# Patient Record
Sex: Female | Born: 2000 | Race: White | Hispanic: No | Marital: Single | Smoking: Never smoker
Health system: Southern US, Community
[De-identification: ages and names within clinical notes are randomized; demographics above are authoritative.]

## PROBLEM LIST (undated history)

## (undated) DIAGNOSIS — R51 Headache: Secondary | ICD-10-CM

## (undated) HISTORY — DX: Headache: R51

## (undated) HISTORY — PX: MOUTH SURGERY: SHX715

---
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
## 2020-10-05 DIAGNOSIS — N926 Irregular menstruation, unspecified: Secondary | ICD-10-CM | POA: Diagnosis not present

## 2020-10-05 DIAGNOSIS — F324 Major depressive disorder, single episode, in partial remission: Secondary | ICD-10-CM | POA: Diagnosis not present

## 2020-10-05 DIAGNOSIS — J309 Allergic rhinitis, unspecified: Secondary | ICD-10-CM | POA: Diagnosis not present

## 2021-05-22 DIAGNOSIS — Z131 Encounter for screening for diabetes mellitus: Secondary | ICD-10-CM | POA: Diagnosis not present

## 2021-05-22 DIAGNOSIS — Z0000 Encounter for general adult medical examination without abnormal findings: Secondary | ICD-10-CM | POA: Diagnosis not present

## 2021-05-22 DIAGNOSIS — Z13220 Encounter for screening for lipoid disorders: Secondary | ICD-10-CM | POA: Diagnosis not present

## 2021-05-22 DIAGNOSIS — N926 Irregular menstruation, unspecified: Secondary | ICD-10-CM | POA: Diagnosis not present

## 2021-06-09 IMAGING — CT CT OF THE LEFT FINGERS WITH CONTRAST
3 of 10 series · 13 of 35 positions shown, 15 images · IV contrast (iopamidol)
Comparison: MRI left hand dated July 12, 2019. Left hand x-rays
dated May 20, 2019.

CLINICAL DATA: Third distal phalanx lesion.

EXAM:
CT OF THE UPPER LEFT EXTREMITY WITH CONTRAST
TECHNIQUE: Multidetector CT imaging of the left hand was performed according to
the standard protocol following intravenous contrast administration.
CONTRAST:  75mL UJFKM3-5CC IOPAMIDOL (UJFKM3-5CC) INJECTION 61%

[Series 3: wrist 1.50 br60 s3 axial bone hd fov · axial · 0.22mm/px · z∈[-633,-505]mm · 5 of 240 slices shown, 7 images]
[im 40/240  soft-tissue]
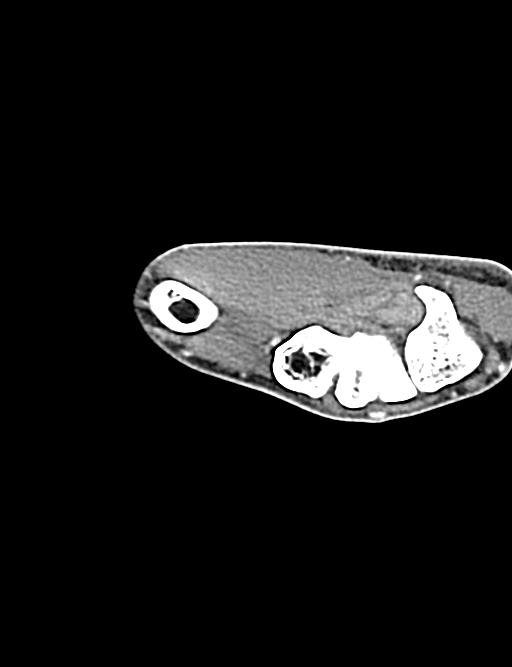
[im 40/240  bone]
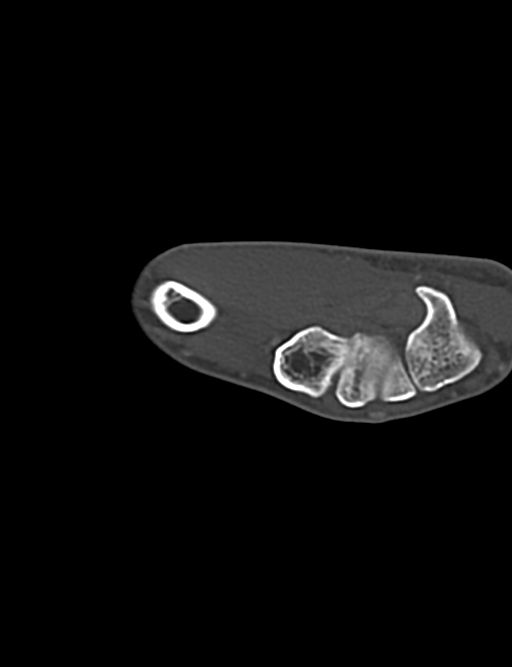
[im 80/240  bone]
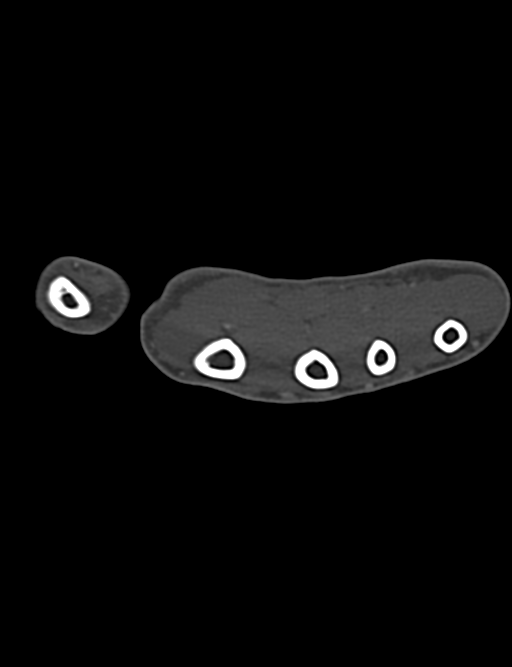
[im 120/240  bone]
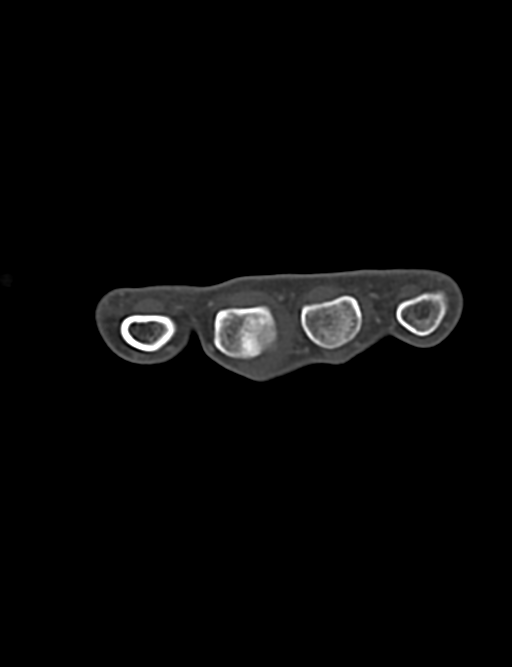
[im 160/240  bone]
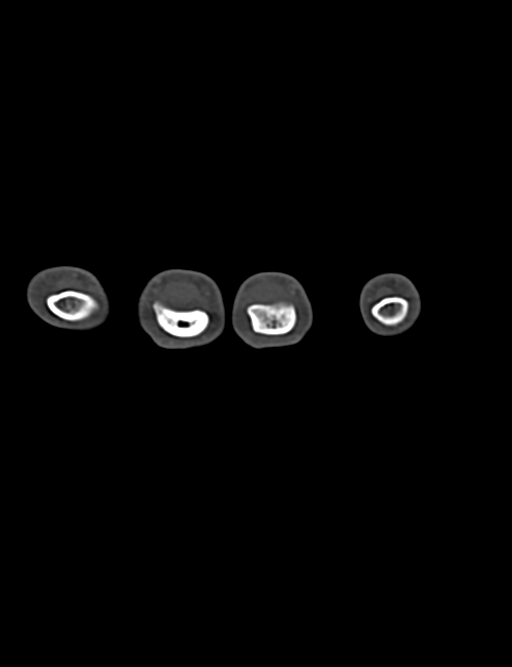
[im 200/240  soft-tissue]
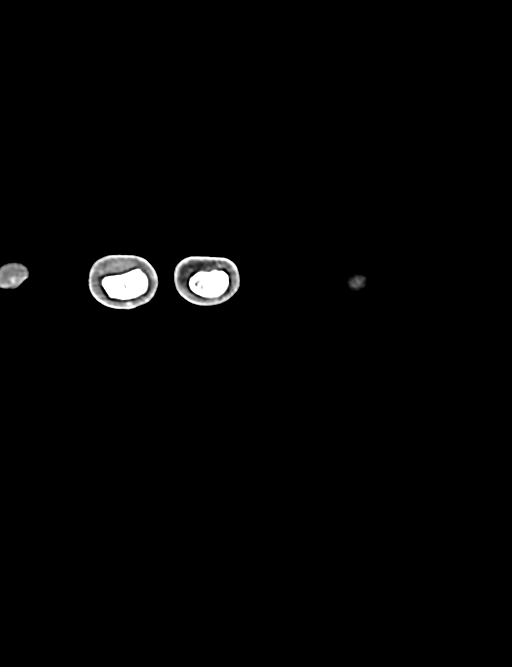
[im 200/240  bone]
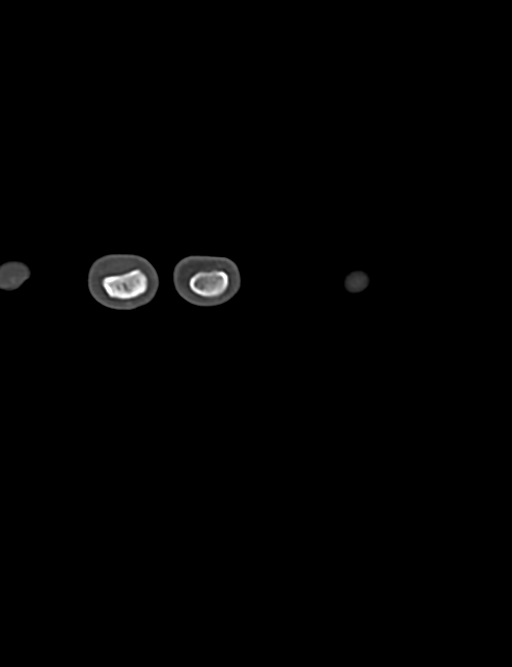

[Series 5: wrist 1.50 br40 s3 axial st hd fov · axial · 0.22mm/px · z∈[-633,-506]mm · 5 of 239 slices shown]
[im 40/239  bone]
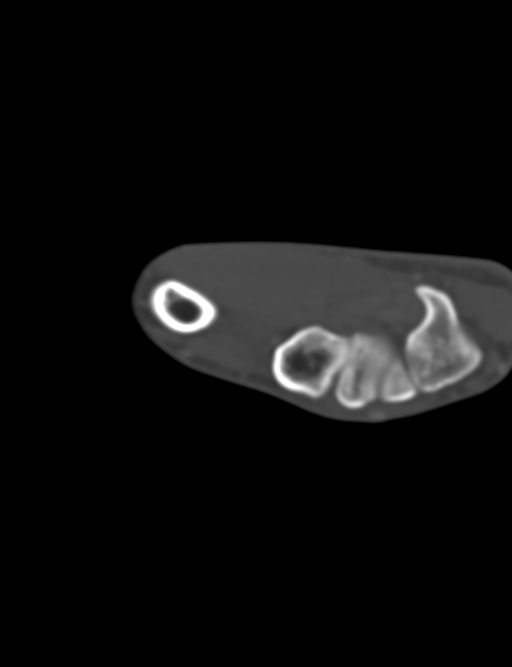
[im 80/239  bone]
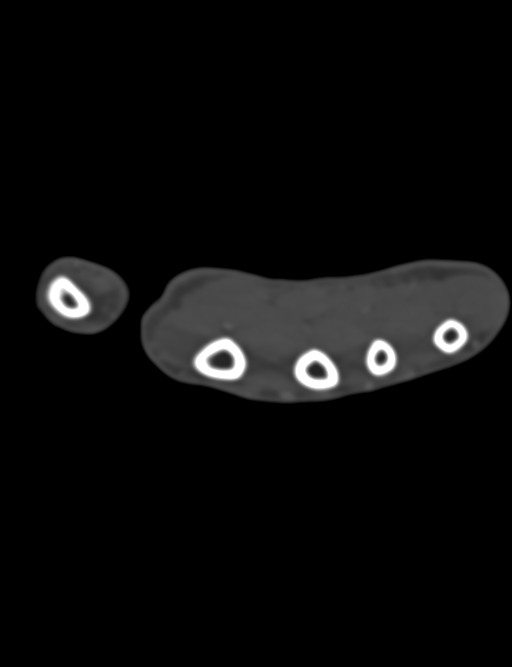
[im 120/239  bone]
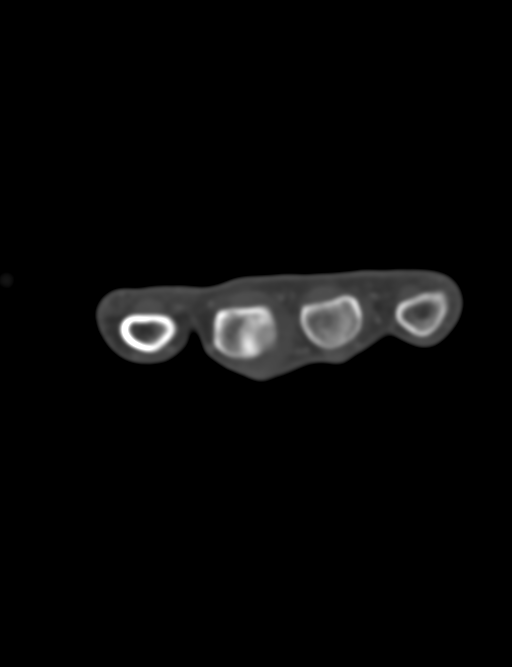
[im 159/239  bone]
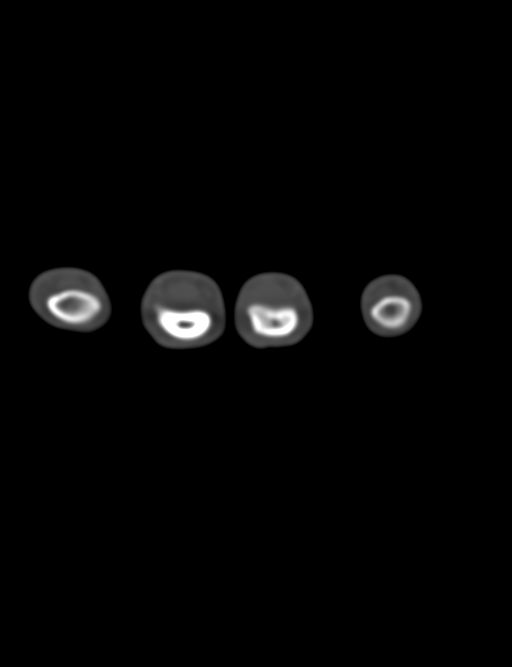
[im 199/239  bone]
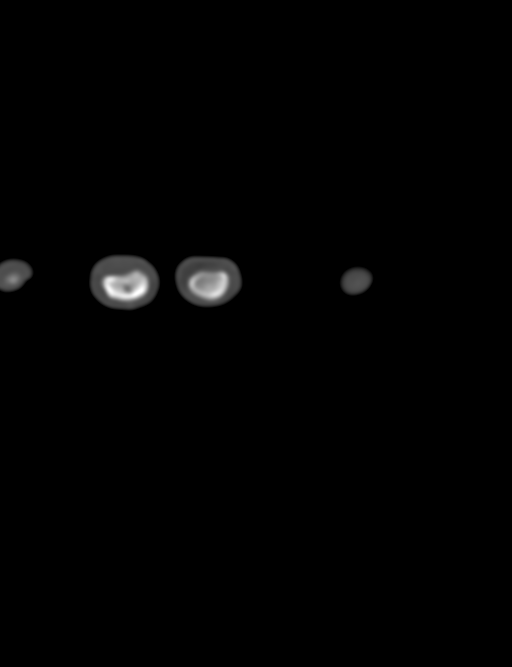

[Series 11: wrist 1.50 br60 s3 sag bone hd fov · sagittal · 0.29mm/px · 3 of 143 slices shown]
[im 36/143  bone]
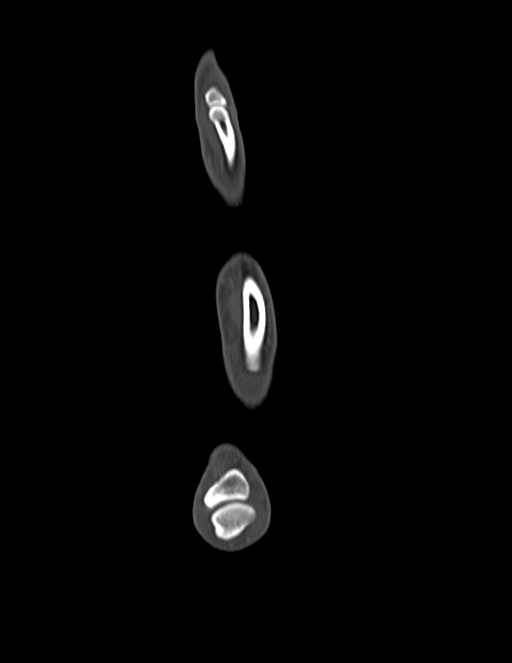
[im 72/143  bone]
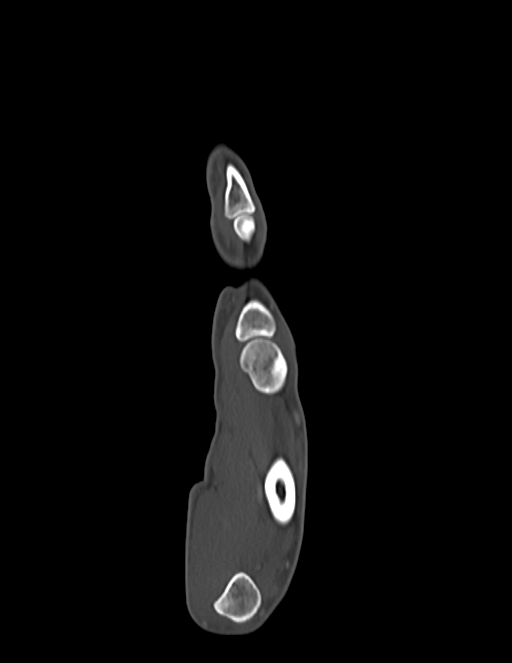
[im 107/143  bone]
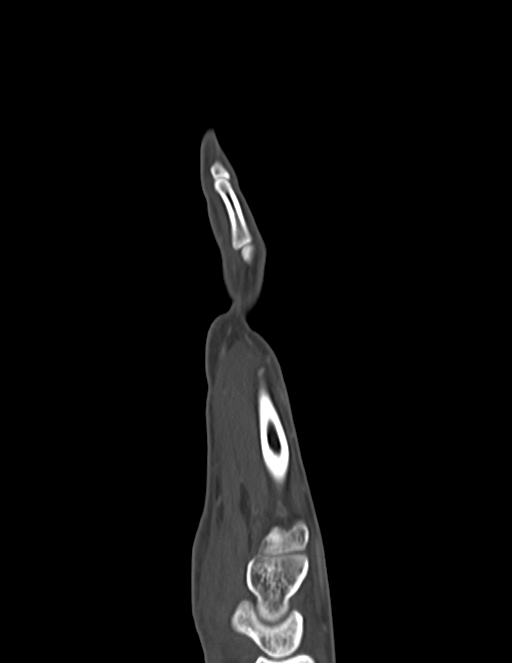

[13 of 35 positions shown; findings below may reference images not displayed]

FINDINGS: Bones/Joint/Cartilage

Again seen is a 4 x 4 x 5 mm exophytic, osseous lesion arising from
the radial and dorsal aspect of the third distal phalanx tuft deep
to the nail bed. This is continuous with the underlying cortex, and
possibly the medullary cavity. No osseous destruction or periosteal
reaction. No fracture or dislocation. Normal alignment. No joint
effusion.

Ligaments

Ligaments are suboptimally evaluated by CT.

Muscles and Tendons
Grossly intact.

Soft tissue
No fluid collection or hematoma.  No soft tissue mass.
IMPRESSION: 1. Non-aggressive appearing 4 x 4 x 5 mm exophytic bony lesion
arising from the third distal phalanx tuft deep to the nail bed.
Characteristics are suggestive of osteochondroma, bizarre parosteal
osteochondromatous proliferation, or subungual exostosis.

## 2021-08-21 DIAGNOSIS — H6121 Impacted cerumen, right ear: Secondary | ICD-10-CM | POA: Diagnosis not present

## 2021-08-21 DIAGNOSIS — L309 Dermatitis, unspecified: Secondary | ICD-10-CM | POA: Diagnosis not present

## 2022-01-14 DIAGNOSIS — S43001A Unspecified subluxation of right shoulder joint, initial encounter: Secondary | ICD-10-CM | POA: Diagnosis not present

## 2022-02-26 DIAGNOSIS — F413 Other mixed anxiety disorders: Secondary | ICD-10-CM | POA: Diagnosis not present

## 2022-03-12 DIAGNOSIS — F413 Other mixed anxiety disorders: Secondary | ICD-10-CM | POA: Diagnosis not present

## 2022-03-26 DIAGNOSIS — F413 Other mixed anxiety disorders: Secondary | ICD-10-CM | POA: Diagnosis not present

## 2022-04-09 DIAGNOSIS — F413 Other mixed anxiety disorders: Secondary | ICD-10-CM | POA: Diagnosis not present

## 2022-04-23 DIAGNOSIS — F413 Other mixed anxiety disorders: Secondary | ICD-10-CM | POA: Diagnosis not present

## 2022-05-14 DIAGNOSIS — F413 Other mixed anxiety disorders: Secondary | ICD-10-CM | POA: Diagnosis not present

## 2022-05-28 DIAGNOSIS — Z113 Encounter for screening for infections with a predominantly sexual mode of transmission: Secondary | ICD-10-CM | POA: Diagnosis not present

## 2022-05-28 DIAGNOSIS — Z13220 Encounter for screening for lipoid disorders: Secondary | ICD-10-CM | POA: Diagnosis not present

## 2022-05-28 DIAGNOSIS — Z124 Encounter for screening for malignant neoplasm of cervix: Secondary | ICD-10-CM | POA: Diagnosis not present

## 2022-05-28 DIAGNOSIS — Z0000 Encounter for general adult medical examination without abnormal findings: Secondary | ICD-10-CM | POA: Diagnosis not present

## 2022-05-28 DIAGNOSIS — Z131 Encounter for screening for diabetes mellitus: Secondary | ICD-10-CM | POA: Diagnosis not present

## 2022-05-28 DIAGNOSIS — Z23 Encounter for immunization: Secondary | ICD-10-CM | POA: Diagnosis not present

## 2022-05-28 DIAGNOSIS — F413 Other mixed anxiety disorders: Secondary | ICD-10-CM | POA: Diagnosis not present

## 2022-06-11 DIAGNOSIS — F413 Other mixed anxiety disorders: Secondary | ICD-10-CM | POA: Diagnosis not present

## 2022-06-25 DIAGNOSIS — F413 Other mixed anxiety disorders: Secondary | ICD-10-CM | POA: Diagnosis not present

## 2022-07-16 DIAGNOSIS — F413 Other mixed anxiety disorders: Secondary | ICD-10-CM | POA: Diagnosis not present

## 2022-08-21 DIAGNOSIS — F413 Other mixed anxiety disorders: Secondary | ICD-10-CM | POA: Diagnosis not present

## 2022-10-22 DIAGNOSIS — F413 Other mixed anxiety disorders: Secondary | ICD-10-CM | POA: Diagnosis not present

## 2022-11-19 DIAGNOSIS — F413 Other mixed anxiety disorders: Secondary | ICD-10-CM | POA: Diagnosis not present

## 2023-03-05 DIAGNOSIS — S61452A Open bite of left hand, initial encounter: Secondary | ICD-10-CM | POA: Diagnosis not present

## 2023-07-23 DIAGNOSIS — F413 Other mixed anxiety disorders: Secondary | ICD-10-CM | POA: Diagnosis not present

## 2023-09-17 DIAGNOSIS — N390 Urinary tract infection, site not specified: Secondary | ICD-10-CM | POA: Diagnosis not present

## 2023-09-17 DIAGNOSIS — R300 Dysuria: Secondary | ICD-10-CM | POA: Diagnosis not present

## 2023-11-04 DIAGNOSIS — Z0000 Encounter for general adult medical examination without abnormal findings: Secondary | ICD-10-CM | POA: Diagnosis not present

## 2023-11-04 DIAGNOSIS — Z13220 Encounter for screening for lipoid disorders: Secondary | ICD-10-CM | POA: Diagnosis not present

## 2024-02-24 DIAGNOSIS — F4323 Adjustment disorder with mixed anxiety and depressed mood: Secondary | ICD-10-CM | POA: Diagnosis not present

## 2024-03-02 DIAGNOSIS — F4323 Adjustment disorder with mixed anxiety and depressed mood: Secondary | ICD-10-CM | POA: Diagnosis not present

## 2024-03-09 DIAGNOSIS — F4323 Adjustment disorder with mixed anxiety and depressed mood: Secondary | ICD-10-CM | POA: Diagnosis not present

## 2024-03-23 DIAGNOSIS — F4323 Adjustment disorder with mixed anxiety and depressed mood: Secondary | ICD-10-CM | POA: Diagnosis not present

## 2024-03-30 DIAGNOSIS — F4323 Adjustment disorder with mixed anxiety and depressed mood: Secondary | ICD-10-CM | POA: Diagnosis not present

## 2024-04-06 DIAGNOSIS — F4323 Adjustment disorder with mixed anxiety and depressed mood: Secondary | ICD-10-CM | POA: Diagnosis not present

## 2024-04-13 DIAGNOSIS — F4323 Adjustment disorder with mixed anxiety and depressed mood: Secondary | ICD-10-CM | POA: Diagnosis not present

## 2024-04-20 DIAGNOSIS — F4323 Adjustment disorder with mixed anxiety and depressed mood: Secondary | ICD-10-CM | POA: Diagnosis not present

## 2024-04-27 DIAGNOSIS — Z713 Dietary counseling and surveillance: Secondary | ICD-10-CM | POA: Diagnosis not present

## 2024-04-27 DIAGNOSIS — F4323 Adjustment disorder with mixed anxiety and depressed mood: Secondary | ICD-10-CM | POA: Diagnosis not present

## 2024-05-04 DIAGNOSIS — F4323 Adjustment disorder with mixed anxiety and depressed mood: Secondary | ICD-10-CM | POA: Diagnosis not present

## 2024-05-08 DIAGNOSIS — Z713 Dietary counseling and surveillance: Secondary | ICD-10-CM | POA: Diagnosis not present

## 2024-05-18 DIAGNOSIS — F4323 Adjustment disorder with mixed anxiety and depressed mood: Secondary | ICD-10-CM | POA: Diagnosis not present

## 2024-05-21 DIAGNOSIS — Z713 Dietary counseling and surveillance: Secondary | ICD-10-CM | POA: Diagnosis not present

## 2024-05-25 DIAGNOSIS — N898 Other specified noninflammatory disorders of vagina: Secondary | ICD-10-CM | POA: Diagnosis not present

## 2024-05-25 DIAGNOSIS — N943 Premenstrual tension syndrome: Secondary | ICD-10-CM | POA: Diagnosis not present

## 2024-05-25 DIAGNOSIS — F4323 Adjustment disorder with mixed anxiety and depressed mood: Secondary | ICD-10-CM | POA: Diagnosis not present

## 2024-05-25 DIAGNOSIS — Z1330 Encounter for screening examination for mental health and behavioral disorders, unspecified: Secondary | ICD-10-CM | POA: Diagnosis not present

## 2024-06-11 DIAGNOSIS — Z713 Dietary counseling and surveillance: Secondary | ICD-10-CM | POA: Diagnosis not present

## 2024-06-15 DIAGNOSIS — F4323 Adjustment disorder with mixed anxiety and depressed mood: Secondary | ICD-10-CM | POA: Diagnosis not present

## 2024-06-22 DIAGNOSIS — F4323 Adjustment disorder with mixed anxiety and depressed mood: Secondary | ICD-10-CM | POA: Diagnosis not present

## 2024-06-29 DIAGNOSIS — F4323 Adjustment disorder with mixed anxiety and depressed mood: Secondary | ICD-10-CM | POA: Diagnosis not present

## 2024-07-06 DIAGNOSIS — F4323 Adjustment disorder with mixed anxiety and depressed mood: Secondary | ICD-10-CM | POA: Diagnosis not present

## 2024-07-09 DIAGNOSIS — Z713 Dietary counseling and surveillance: Secondary | ICD-10-CM | POA: Diagnosis not present

## 2024-07-23 DIAGNOSIS — Z713 Dietary counseling and surveillance: Secondary | ICD-10-CM | POA: Diagnosis not present

## 2024-08-03 DIAGNOSIS — Z713 Dietary counseling and surveillance: Secondary | ICD-10-CM | POA: Diagnosis not present

## 2024-08-03 DIAGNOSIS — F4323 Adjustment disorder with mixed anxiety and depressed mood: Secondary | ICD-10-CM | POA: Diagnosis not present

## 2024-08-10 DIAGNOSIS — F4323 Adjustment disorder with mixed anxiety and depressed mood: Secondary | ICD-10-CM | POA: Diagnosis not present

## 2024-08-17 DIAGNOSIS — F4323 Adjustment disorder with mixed anxiety and depressed mood: Secondary | ICD-10-CM | POA: Diagnosis not present

## 2024-08-17 DIAGNOSIS — Z713 Dietary counseling and surveillance: Secondary | ICD-10-CM | POA: Diagnosis not present

## 2024-08-31 DIAGNOSIS — Z713 Dietary counseling and surveillance: Secondary | ICD-10-CM | POA: Diagnosis not present

## 2024-08-31 DIAGNOSIS — F4323 Adjustment disorder with mixed anxiety and depressed mood: Secondary | ICD-10-CM | POA: Diagnosis not present

## 2024-09-07 DIAGNOSIS — F4323 Adjustment disorder with mixed anxiety and depressed mood: Secondary | ICD-10-CM | POA: Diagnosis not present

## 2024-09-14 DIAGNOSIS — F4323 Adjustment disorder with mixed anxiety and depressed mood: Secondary | ICD-10-CM | POA: Diagnosis not present

## 2024-09-17 DIAGNOSIS — Z713 Dietary counseling and surveillance: Secondary | ICD-10-CM | POA: Diagnosis not present

## 2024-09-28 DIAGNOSIS — Z713 Dietary counseling and surveillance: Secondary | ICD-10-CM | POA: Diagnosis not present

## 2024-09-29 DIAGNOSIS — S161XXA Strain of muscle, fascia and tendon at neck level, initial encounter: Secondary | ICD-10-CM | POA: Diagnosis not present

## 2024-10-06 DIAGNOSIS — M436 Torticollis: Secondary | ICD-10-CM | POA: Diagnosis not present

## 2024-10-06 DIAGNOSIS — M542 Cervicalgia: Secondary | ICD-10-CM | POA: Diagnosis not present

## 2024-10-12 DIAGNOSIS — M436 Torticollis: Secondary | ICD-10-CM | POA: Diagnosis not present

## 2024-10-12 DIAGNOSIS — F4323 Adjustment disorder with mixed anxiety and depressed mood: Secondary | ICD-10-CM | POA: Diagnosis not present

## 2024-10-12 DIAGNOSIS — M542 Cervicalgia: Secondary | ICD-10-CM | POA: Diagnosis not present

## 2024-10-15 DIAGNOSIS — Z713 Dietary counseling and surveillance: Secondary | ICD-10-CM | POA: Diagnosis not present

## 2024-10-19 DIAGNOSIS — F4323 Adjustment disorder with mixed anxiety and depressed mood: Secondary | ICD-10-CM | POA: Diagnosis not present

## 2024-10-20 DIAGNOSIS — M436 Torticollis: Secondary | ICD-10-CM | POA: Diagnosis not present

## 2024-10-20 DIAGNOSIS — M542 Cervicalgia: Secondary | ICD-10-CM | POA: Diagnosis not present

## 2024-10-26 DIAGNOSIS — M436 Torticollis: Secondary | ICD-10-CM | POA: Diagnosis not present

## 2024-10-26 DIAGNOSIS — M542 Cervicalgia: Secondary | ICD-10-CM | POA: Diagnosis not present

## 2024-10-26 DIAGNOSIS — F4323 Adjustment disorder with mixed anxiety and depressed mood: Secondary | ICD-10-CM | POA: Diagnosis not present

## 2024-10-29 DIAGNOSIS — Z713 Dietary counseling and surveillance: Secondary | ICD-10-CM | POA: Diagnosis not present

## 2024-11-02 DIAGNOSIS — F4323 Adjustment disorder with mixed anxiety and depressed mood: Secondary | ICD-10-CM | POA: Diagnosis not present

## 2024-11-02 DIAGNOSIS — M542 Cervicalgia: Secondary | ICD-10-CM | POA: Diagnosis not present

## 2024-11-02 DIAGNOSIS — M436 Torticollis: Secondary | ICD-10-CM | POA: Diagnosis not present

## 2024-11-09 DIAGNOSIS — F4323 Adjustment disorder with mixed anxiety and depressed mood: Secondary | ICD-10-CM | POA: Diagnosis not present

## 2024-11-09 DIAGNOSIS — R7301 Impaired fasting glucose: Secondary | ICD-10-CM | POA: Diagnosis not present

## 2024-11-09 DIAGNOSIS — J309 Allergic rhinitis, unspecified: Secondary | ICD-10-CM | POA: Diagnosis not present

## 2024-11-09 DIAGNOSIS — Z681 Body mass index (BMI) 19 or less, adult: Secondary | ICD-10-CM | POA: Diagnosis not present

## 2024-11-09 DIAGNOSIS — Z0000 Encounter for general adult medical examination without abnormal findings: Secondary | ICD-10-CM | POA: Diagnosis not present

## 2024-11-09 DIAGNOSIS — N898 Other specified noninflammatory disorders of vagina: Secondary | ICD-10-CM | POA: Diagnosis not present

## 2024-11-09 DIAGNOSIS — N926 Irregular menstruation, unspecified: Secondary | ICD-10-CM | POA: Diagnosis not present

## 2024-11-09 DIAGNOSIS — Z23 Encounter for immunization: Secondary | ICD-10-CM | POA: Diagnosis not present

## 2024-11-09 DIAGNOSIS — F324 Major depressive disorder, single episode, in partial remission: Secondary | ICD-10-CM | POA: Diagnosis not present

## 2024-11-09 DIAGNOSIS — Z113 Encounter for screening for infections with a predominantly sexual mode of transmission: Secondary | ICD-10-CM | POA: Diagnosis not present

## 2024-11-09 DIAGNOSIS — Z13220 Encounter for screening for lipoid disorders: Secondary | ICD-10-CM | POA: Diagnosis not present

## 2024-11-12 DIAGNOSIS — Z713 Dietary counseling and surveillance: Secondary | ICD-10-CM | POA: Diagnosis not present

## 2024-11-16 DIAGNOSIS — F4323 Adjustment disorder with mixed anxiety and depressed mood: Secondary | ICD-10-CM | POA: Diagnosis not present

## 2024-11-23 DIAGNOSIS — F4323 Adjustment disorder with mixed anxiety and depressed mood: Secondary | ICD-10-CM | POA: Diagnosis not present

## 2024-11-26 DIAGNOSIS — Z713 Dietary counseling and surveillance: Secondary | ICD-10-CM | POA: Diagnosis not present

## 2024-11-30 DIAGNOSIS — F4323 Adjustment disorder with mixed anxiety and depressed mood: Secondary | ICD-10-CM | POA: Diagnosis not present

## 2024-12-07 DIAGNOSIS — F4323 Adjustment disorder with mixed anxiety and depressed mood: Secondary | ICD-10-CM | POA: Diagnosis not present
